# Patient Record
Sex: Female | Born: 2005 | Race: White | Hispanic: No | Marital: Single | Smoking: Never smoker
Health system: Southern US, Community
[De-identification: ages and names within clinical notes are randomized; demographics above are authoritative.]

## PROBLEM LIST (undated history)

## (undated) DIAGNOSIS — L709 Acne, unspecified: Secondary | ICD-10-CM

## (undated) HISTORY — DX: Acne, unspecified: L70.9

---
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
## 2023-01-26 ENCOUNTER — Encounter: Payer: Self-pay | Admitting: Dermatology

## 2023-01-26 ENCOUNTER — Ambulatory Visit: Payer: BC Managed Care – PPO | Admitting: Dermatology

## 2023-01-26 VITALS — BP 117/66

## 2023-01-26 DIAGNOSIS — Z79899 Other long term (current) drug therapy: Secondary | ICD-10-CM

## 2023-01-26 DIAGNOSIS — Q841 Congenital morphological disturbances of hair, not elsewhere classified: Secondary | ICD-10-CM

## 2023-01-26 DIAGNOSIS — L700 Acne vulgaris: Secondary | ICD-10-CM

## 2023-01-26 NOTE — Progress Notes (Signed)
   Follow-Up Visit   Subjective  Erica Hicks is a 17 y.o. female who presents for the following: Acne 63m f/u,face improving Doxycycline 20mg  bid, Winlevi cr bid, Aklief cr qhs, Amzeeq foam qhs The patient has spots, moles and lesions to be evaluated, some may be new or changing and the patient may have concern these could be cancer.   The following portions of the chart were reviewed this encounter and updated as appropriate: medications, allergies, medical history  Review of Systems:  No other skin or systemic complaints except as noted in HPI or Assessment and Plan.  Objective  Well appearing patient in no apparent distress; mood and affect are within normal limits.   A focused examination was performed of the following areas: face  Relevant exam findings are noted in the Assessment and Plan.    Assessment & Plan   ACNE VULGARIS face Exam: R cheek L chin inflamed paps  Failed spironolactone due to continuous menstrual bleeding  Chronic and persistent condition with duration or expected duration over one year. Condition is improving with treatment. currently at goal (patient satisfied).   Treatment Plan: Cont Doxycycline 20mg  1 po bid with food and drink (pt will call for refills) Cont Winlevi cr bid to face Cont Aklief cr qhs to face Cont Amzeeq foam qhs to face  Doxycycline should be taken with food to prevent nausea. Do not lay down for 30 minutes after taking. Be cautious with sun exposure and use good sun protection while on this medication. Pregnant women should not take this medication.   Topical retinoid medications like tretinoin/Retin-A, adapalene/Differin, tazarotene/Fabior, and Epiduo/Epiduo Forte can cause dryness and irritation when first started. Only apply a pea-sized amount to the entire affected area. Avoid applying it around the eyes, edges of mouth and creases at the nose. If you experience irritation, use a good moisturizer first and/or apply  the medicine less often. If you are doing well with the medicine, you can increase how often you use it until you are applying every night. Be careful with sun protection while using this medication as it can make you sensitive to the sun. This medicine should not be used by pregnant women.   Long term medication management.  Patient is using long term (months to years) prescription medication  to control their dermatologic condition.  These medications require periodic monitoring to evaluate for efficacy and side effects and may require periodic laboratory monitoring.   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 Cosmetically subtle  Return in about 1 year (around 01/26/2024) for Acne f/u.  I, Ardis Rowan, RMA, am acting as scribe for Elie Goody, MD .   Documentation: I have reviewed the above documentation for accuracy and completeness, and I agree with the above.  Elie Goody, MD

## 2023-01-26 NOTE — Patient Instructions (Addendum)

## 2023-11-21 ENCOUNTER — Ambulatory Visit: Admitting: Dermatology

## 2023-11-21 DIAGNOSIS — Z79899 Other long term (current) drug therapy: Secondary | ICD-10-CM

## 2023-11-21 DIAGNOSIS — Z7189 Other specified counseling: Secondary | ICD-10-CM | POA: Diagnosis not present

## 2023-11-21 DIAGNOSIS — L700 Acne vulgaris: Secondary | ICD-10-CM

## 2023-11-21 MED ORDER — DOXYCYCLINE HYCLATE 100 MG PO CAPS: 100.0000 mg | ORAL_CAPSULE | Freq: Two times a day (BID) | ORAL | 0 refills | Status: AC

## 2023-11-21 MED ORDER — DOXYCYCLINE HYCLATE 100 MG PO CAPS: 100.0000 mg | ORAL_CAPSULE | Freq: Two times a day (BID) | ORAL | 0 refills | Status: DC

## 2023-11-21 NOTE — Progress Notes (Unsigned)
   Follow-Up Visit   Subjective  Erica Hicks is a 18 y.o. female who presents for the following: acne flare. Patient advises that she stopped taking and using rx'd acne medications in February/March because they weren't working, started to get more oily and more blackheads. Patient's acne flared last week.  The following portions of the chart were reviewed this encounter and updated as appropriate: medications, allergies, medical history  Review of Systems:  No other skin or systemic complaints except as noted in HPI or Assessment and Plan.  Objective  Well appearing patient in no apparent distress; mood and affect are within normal limits.   A focused examination was performed of the following areas: face  Relevant exam findings are noted in the Assessment and Plan.    Assessment & Plan   ACNE VULGARIS Exam: Open comedones and inflammatory papules***  Chronic and persistent condition with duration or expected duration over one year. Condition is bothersome/symptomatic for patient. Currently flared.  Treatment Plan: Start doxycycline  100 mg BID with food.  Doxycycline  should be taken with food to prevent nausea. Do not lay down for 30 minutes after taking. Be cautious with sun exposure and use good sun protection while on this medication. Pregnant women should not take this medication.   No hx of IBD, Crohn's.   Abstinence for birth control iPledge # [id number] Patient registered in iPledge program, in-office UPT negative.   Isotretinoin Counseling; Review and Contraception Counseling: Reviewed potential side effects of isotretinoin including xerosis, cheilitis, hepatitis, hyperlipidemia, and severe birth defects if taken by a pregnant woman.  Women on isotretinoin must be celibate (not having sex) or required to use at least 2 birth control methods to prevent pregnancy (unless patient is a female of non-child bearing potential).  Females of child-bearing potential  must have monthly pregnancy tests while on isotretinoin and report through I-Pledge (FDA monitoring program). Reviewed reports of suicidal ideation in those with a history of depression while taking isotretinoin and reports of diagnosis of inflammatory bowl disease (IBD) while taking isotretinoin as well as the lack of evidence for a causal relationship between isotretinoin, depression and IBD. Patient advised to reach out with any questions or concerns. Patient advised not to share pills or donate blood while on treatment or for one month after completing treatment. All patient's considering Isotretinoin must read and understand and sign Isotretinoin Consent Form and be registered with I-Pledge.  ACNE VULGARIS   Related Procedures ALT Triglycerides Related Medications AKLIEF  0.005 % CREA Apply a pea-sized amount to face every night as tolerated. Clascoterone  (WINLEVI ) 1 % CREA Apply a thin coat to the face BID. Minocycline  HCl Micronized (AMZEEQ ) 4 % FOAM Apply a very thin layer to the face on top of Aklief  QHS.  Return in about 30 days (around 12/21/2023) for Isotretinoin, with Dr. Claudene.  LILLETTE Lonell Drones, RMA, am acting as scribe for Boneta Claudene, MD .   Documentation: I have reviewed the above documentation for accuracy and completeness, and I agree with the above.  Boneta Claudene, MD

## 2023-11-21 NOTE — Patient Instructions (Signed)
Isotretinoin Counseling; Review and Contraception Counseling: Reviewed potential side effects of isotretinoin including xerosis, cheilitis, hepatitis, hyperlipidemia, and severe birth defects if taken by a pregnant woman.  Women on isotretinoin must be celibate (not having sex) or required to use at least 2 birth control methods to prevent pregnancy (unless patient is a female of non-child bearing potential).  Females of child-bearing potential must have monthly pregnancy tests while on isotretinoin and report through I-Pledge (FDA monitoring program). Reviewed reports of suicidal ideation in those with a history of depression while taking isotretinoin and reports of diagnosis of inflammatory bowl disease (IBD) while taking isotretinoin as well as the lack of evidence for a causal relationship between isotretinoin, depression and IBD. Patient advised to reach out with any questions or concerns. Patient advised not to share pills or donate blood while on treatment or for one month after completing treatment. All patient's considering Isotretinoin must read and understand and sign Isotretinoin Consent Form and be registered with I-Pledge.   Due to recent changes in healthcare laws, you may see results of your pathology and/or laboratory studies on MyChart before the doctors have had a chance to review them. We understand that in some cases there may be results that are confusing or concerning to you. Please understand that not all results are received at the same time and often the doctors may need to interpret multiple results in order to provide you with the best plan of care or course of treatment. Therefore, we ask that you please give [place] 2 business days to thoroughly review all your results before contacting the office for clarification. Should we see a critical lab result, you will be contacted sooner.   If You Need Anything After Your Visit  If you have any questions or concerns for your doctor,  please call our main line at [phone] and press option 4 to reach your doctor's medical assistant. If no one answers, please leave a voicemail as directed and we will return your call as soon as possible. Messages left after 4 pm will be answered the following business day.   You may also send [place] a message via MyChart. We typically respond to MyChart messages within 1-2 business days.  For prescription refills, please ask your pharmacy to contact our office. Our fax number is [phone].  If you have an urgent issue when the clinic is closed that cannot wait until the next business day, you can page your doctor at the number below.    Please note that while we do our best to be available for urgent issues outside of office hours, we are not available 24/7.   If you have an urgent issue and are unable to reach [place], you may choose to seek medical care at your doctor's office, retail clinic, urgent care center, or emergency room.  If you have a medical emergency, please immediately call 911 or go to the emergency department.  Pager Numbers  - Dr. Gwen Pounds: [phone]  - Dr. Roseanne Reno: [phone]  - Dr. Katrinka Blazing: [phone]   In the event of inclement weather, please call our main line at [phone] for an update on the status of any delays or closures.  Dermatology Medication Tips: Please keep the boxes that topical medications come in in order to help keep track of the instructions about where and how to use these. Pharmacies typically print the medication instructions only on the boxes and not directly on the medication tubes.   If your medication is too expensive,  please contact our office at [phone] option 4 or send [place] a message through MyChart.   We are unable to tell what your co-pay for medications will be in advance as this is different depending on your insurance coverage. However, we may be able to find a substitute medication at lower cost or fill out paperwork to  get insurance to cover a needed medication.   If a prior authorization is required to get your medication covered by your insurance company, please allow [place] 1-2 business days to complete this process.  Drug prices often vary depending on where the prescription is filled and some pharmacies may offer cheaper prices.  The website www.goodrx.com contains coupons for medications through different pharmacies. The prices here do not account for what the cost may be with help from insurance (it may be cheaper with your insurance), but the website can give you the price if you did not use any insurance.  - You can print the associated coupon and take it with your prescription to the pharmacy.  - You may also stop by our office during regular business hours and pick up a GoodRx coupon card.  - If you need your prescription sent electronically to a different pharmacy, notify our office through [facility] or by phone at [phone] option 4.     Si Usted Necesita Algo Despus de Su Visita  Tambin puede enviarnos un mensaje a travs de Clinical cytogeneticist. Por lo general respondemos a los mensajes de MyChart en el transcurso de 1 a 2 das hbiles.  Para renovar recetas, por favor pida a su farmacia que se ponga en contacto con nuestra oficina. Annie Sable de fax es [place] [phone].  Si tiene un asunto urgente cuando la clnica est cerrada y que no puede esperar hasta el siguiente da hbil, puede llamar/localizar a su doctor(a) al nmero que aparece a continuacin.   Por favor, tenga en cuenta que aunque hacemos todo lo posible para estar disponibles para asuntos urgentes fuera del horario de [place], no estamos disponibles las 24 horas del da, los 7 [address]  [address] de la [place].   Si tiene un problema urgente y no puede comunicarse con nosotros, puede optar por buscar atencin mdica  en el consultorio de su doctor(a), en una clnica privada, en un centro de atencin urgente o en una sala de emergencias.  Si  tiene Engineer, drilling, por favor llame inmediatamente al 911 o vaya a la sala de emergencias.  Nmeros de bper  - Dr. Gwen Pounds: [phone]  - Dra. Roseanne Reno: [phone]  - Dr. Katrinka Blazing: [phone]   En caso de inclemencias del tiempo, por favor llame a Lacy Duverney principal al [phone] para una actualizacin sobre el [place] de cualquier retraso o cierre.  Consejos para la medicacin en dermatologa: Por favor, guarde las cajas en las que vienen los medicamentos de uso tpico para ayudarle a seguir las instrucciones sobre dnde y cmo usarlos. Las farmacias generalmente imprimen las instrucciones del medicamento slo en las cajas y no directamente en los tubos del [place].   Si su medicamento es muy caro, por favor, pngase en contacto con Rolm Gala llamando al [phone] y presione la opcin 4 o envenos un mensaje a travs de Clinical cytogeneticist.   No podemos decirle cul ser su copago por los medicamentos por adelantado ya que esto es diferente dependiendo de la cobertura de su seguro. Sin embargo, es posible que podamos encontrar un medicamento sustituto a Audiological scientist un formulario para que el seguro  cubra el medicamento que se considera necesario.   Si se requiere una autorizacin previa para que su compaa de seguros [place] su medicamento, por favor permtanos de 1 a 2 das hbiles para completar [address]  Los precios de los medicamentos varan con frecuencia dependiendo del Environmental consultant de dnde se surte la receta y alguna farmacias pueden ofrecer precios ms baratos.  El sitio web www.goodrx.com tiene cupones para medicamentos de Health and safety inspector. Los precios aqu no tienen en cuenta lo que podra costar con la ayuda del seguro (puede ser ms barato con su seguro), pero el sitio web puede darle el precio si no utiliz Tourist information centre manager.  - Puede imprimir el cupn correspondiente y llevarlo con su receta a la farmacia.  - Tambin puede pasar por nuestra oficina  durante el horario de atencin regular y Education officer, museum una tarjeta de cupones de GoodRx.  - Si necesita que su receta se enve electrnicamente a una farmacia diferente, informe a nuestra oficina a travs de MyChart de [place] o por telfono llamando al [phone] y presione la opcin 4.

## 2023-11-22 ENCOUNTER — Encounter: Payer: Self-pay | Admitting: Dermatology

## 2023-11-28 ENCOUNTER — Ambulatory Visit: Payer: Self-pay | Admitting: Dermatology

## 2023-11-28 LAB — ALT: ALT: 10 IU/L (ref 0–32)

## 2023-11-28 LAB — TRIGLYCERIDES: Triglycerides: 97 mg/dL (ref 0–149)

## 2023-11-28 NOTE — Telephone Encounter (Signed)
 Patient advised labs ok. Keep appt as scheduled. Lonell RAMAN., RMA

## 2023-11-28 NOTE — Telephone Encounter (Signed)
-----   Message from [place] sent at 11/28/2023 10:59 AM EDT ----- Please call to share that ALT and triglycerides were normal. Patient will not get MyChart notification with message. Thank you ----- Message ----- From: Interface, [facility] Lab Results In Sent: 11/28/2023   7:36 AM EDT To: Boneta Sharps, MD

## 2023-12-28 ENCOUNTER — Ambulatory Visit

## 2023-12-28 ENCOUNTER — Ambulatory Visit (INDEPENDENT_AMBULATORY_CARE_PROVIDER_SITE_OTHER): Admitting: Dermatology

## 2023-12-28 ENCOUNTER — Encounter: Payer: Self-pay | Admitting: Dermatology

## 2023-12-28 DIAGNOSIS — Z7189 Other specified counseling: Secondary | ICD-10-CM | POA: Diagnosis not present

## 2023-12-28 DIAGNOSIS — L700 Acne vulgaris: Secondary | ICD-10-CM | POA: Diagnosis not present

## 2023-12-28 DIAGNOSIS — Z79899 Other long term (current) drug therapy: Secondary | ICD-10-CM

## 2023-12-28 MED ORDER — ISOTRETINOIN 20 MG PO CAPS: 20.0000 mg | ORAL_CAPSULE | Freq: Every day | ORAL | 0 refills | Status: DC

## 2023-12-28 NOTE — Patient Instructions (Addendum)

## 2023-12-28 NOTE — Progress Notes (Signed)
   Follow-Up Visit   Subjective  Erica Hicks is a 18 y.o. female who presents for the following: Acne, face, pt d/c Doxycycline  100mg  1 wk ago, pt did see a little improvement with Doxycycline , pt to start Isotretinoin    The following portions of the chart were reviewed this encounter and updated as appropriate: medications, allergies, medical history  Review of Systems:  No other skin or systemic complaints except as noted in HPI or Assessment and Plan.  Objective  Well appearing patient in no apparent distress; mood and affect are within normal limits.   A focused examination was performed of the following areas: face  Relevant exam findings are noted in the Assessment and Plan.    Assessment & Plan   ACNE VULGARIS severe face  Exam: numerous inflammatory papules and scars on cheeks chin   Chronic and persistent condition with duration or expected duration over one year. Condition is bothersome/symptomatic for patient. Currently flared.   Treatment Plan: Urine pregnancy test performed in office today and was negative.  Patient demonstrates comprehension and confirms she will not get pregnant. L ot [id number] exp 06/13/2025  ALT TG baseline normal D/C Doxycycline  Birth control - abstinence  iPledge # [id number] Pharmacy - [facility] confirmed in Ipledge program Start Isotretinoin  20mg  1 po qd with fatty meal  Isotretinoin  Counseling; Review and Contraception Counseling: Reviewed potential side effects of isotretinoin  including xerosis, cheilitis, hepatitis, hyperlipidemia, and severe birth defects if taken by a pregnant woman.  Women on isotretinoin  must be celibate (not having sex) or required to use at least 2 birth control methods to prevent pregnancy (unless patient is a female of non-child bearing potential).  Females of child-bearing potential must have monthly pregnancy tests while on isotretinoin  and report through I-Pledge (FDA monitoring  program). Reviewed reports of suicidal ideation in those with a history of depression while taking isotretinoin  and reports of diagnosis of inflammatory bowl disease (IBD) while taking isotretinoin  as well as the lack of evidence for a causal relationship between isotretinoin , depression and IBD. Patient advised to reach out with any questions or concerns. Patient advised not to share pills or donate blood while on treatment or for one month after completing treatment. All patient's considering Isotretinoin  must read and understand and sign Isotretinoin  Consent Form and be registered with I-Pledge.     ACNE VULGARIS   Related Medications AKLIEF  0.005 % CREA Apply a pea-sized amount to face every night as tolerated. Clascoterone  (WINLEVI ) 1 % CREA Apply a thin coat to the face BID. Minocycline  HCl Micronized (AMZEEQ ) 4 % FOAM Apply a very thin layer to the face on top of [place]  QHS. LONG-TERM USE OF HIGH-RISK MEDICATION   COUNSELING AND COORDINATION OF CARE    Return in about 1 month (around 01/27/2024) for Isotretinoin  f/u.  I, Grayce Saunas, RMA, am acting as scribe for Boneta Sharps, MD .   Documentation: I have reviewed the above documentation for accuracy and completeness, and I agree with the above.  Boneta Sharps, MD

## 2024-01-25 ENCOUNTER — Ambulatory Visit: Admitting: Dermatology

## 2024-01-25 ENCOUNTER — Encounter: Payer: Self-pay | Admitting: Dermatology

## 2024-01-25 VITALS — Wt 130.0 lb

## 2024-01-25 DIAGNOSIS — Z7189 Other specified counseling: Secondary | ICD-10-CM

## 2024-01-25 DIAGNOSIS — Z79899 Other long term (current) drug therapy: Secondary | ICD-10-CM | POA: Diagnosis not present

## 2024-01-25 DIAGNOSIS — L700 Acne vulgaris: Secondary | ICD-10-CM | POA: Diagnosis not present

## 2024-01-25 DIAGNOSIS — K130 Diseases of lips: Secondary | ICD-10-CM

## 2024-01-25 MED ORDER — ISOTRETINOIN 30 MG PO CAPS: 30.0000 mg | ORAL_CAPSULE | Freq: Every day | ORAL | 0 refills | Status: AC

## 2024-01-25 NOTE — Patient Instructions (Signed)
 Increase to Isotretinoin  30 mg once daily with food.   While taking Isotretinoin  and for 30 days after you finish the medication, do not get pregnant, do not share pills, do not donate blood.  Generic isotretinoin  is best absorbed when taken with a fatty meal. Isotretinoin  can make you sensitive to the sun. Daily careful sun protection including sunscreen SPF 30+ when outdoors is recommended.   Due to recent changes in healthcare laws, you may see results of your pathology and/or laboratory studies on MyChart before the doctors have had a chance to review them. We understand that in some cases there may be results that are confusing or concerning to you. Please understand that not all results are received at the same time and often the doctors may need to interpret multiple results in order to provide you with the best plan of care or course of treatment. Therefore, we ask that you please give us  2 business days to thoroughly review all your results before contacting the office for clarification. Should we see a critical lab result, you will be contacted sooner.   If You Need Anything After Your Visit  If you have any questions or concerns for your doctor, please call our main line at [phone] and press option 4 to reach your doctor's medical assistant. If no one answers, please leave a voicemail as directed and we will return your call as soon as possible. Messages left after 4 pm will be answered the following business day.   You may also send us  a message via MyChart. We typically respond to MyChart messages within 1-2 business days.  For prescription refills, please ask your pharmacy to contact our office. Our fax number is [phone].  If you have an urgent issue when the clinic is closed that cannot wait until the next business day, you can page your doctor at the number below.    Please note that while we do our best to be available for urgent issues outside of office hours, we are  not available 24/7.   If you have an urgent issue and are unable to reach us , you may choose to seek medical care at your doctor's office, retail clinic, urgent care center, or emergency room.  If you have a medical emergency, please immediately call 911 or go to the emergency department.  Pager Numbers  - Dr. Hester: [phone]  - Dr. Jackquline: [phone]  - Dr. Claudene: [phone]   - Dr. Raymund: [phone]  In the event of inclement weather, please call our main line at [phone] for an update on the status of any delays or closures.  Dermatology Medication Tips: Please keep the boxes that topical medications come in in order to help keep track of the instructions about where and how to use these. Pharmacies typically print the medication instructions only on the boxes and not directly on the medication tubes.   If your medication is too expensive, please contact our office at [phone] option 4 or send us  a message through MyChart.   We are unable to tell what your co-pay for medications will be in advance as this is different depending on your insurance coverage. However, we may be able to find a substitute medication at lower cost or fill out paperwork to get insurance to cover a needed medication.   If a prior authorization is required to get your medication covered by your insurance company, please allow us  1-2 business days to complete this process.  Drug prices often vary  depending on where the prescription is filled and some pharmacies may offer cheaper prices.  The website www.goodrx.com contains coupons for medications through different pharmacies. The prices here do not account for what the cost may be with help from insurance (it may be cheaper with your insurance), but the website can give you the price if you did not use any insurance.  - You can print the associated coupon and take it with your prescription to the pharmacy.  - You may also stop by our  office during regular business hours and pick up a GoodRx coupon card.  - If you need your prescription sent electronically to a different pharmacy, notify our office through [facility] or by phone at [phone] option 4.     Si Usted Necesita Algo Despus de Su Visita  Tambin puede enviarnos un mensaje a travs de Clinical cytogeneticist. Por lo general respondemos a los mensajes de MyChart en el transcurso de 1 a 2 das hbiles.  Para renovar recetas, por favor pida a su farmacia que se ponga en contacto con nuestra oficina. Randi lakes de fax es [place] [phone].  Si tiene un asunto urgente cuando la clnica est cerrada y que no puede esperar hasta el siguiente da hbil, puede llamar/localizar a su doctor(a) al nmero que aparece a continuacin.   Por favor, tenga en cuenta que aunque hacemos todo lo posible para estar disponibles para asuntos urgentes fuera del horario de [place], no estamos disponibles las 24 horas del da, los 7 [address]  [address] de la [place].   Si tiene un problema urgente y no puede comunicarse con nosotros, puede optar por buscar atencin mdica  en el consultorio de su doctor(a), en una clnica privada, en un centro de atencin urgente o en una sala de emergencias.  Si tiene Engineer, drilling, por favor llame inmediatamente al 911 o vaya a la sala de emergencias.  Nmeros de bper  - Dr. Hester: [phone]  - Dra. Jackquline: [phone]  - Dr. Claudene: [phone]  - Dra. Kitts: [phone]  En caso de inclemencias del [place], por favor llame a nuestra lnea principal al [phone] para una actualizacin sobre el [place] de cualquier retraso o cierre.  Consejos para la medicacin en dermatologa: Por favor, guarde las cajas en las que vienen los medicamentos de uso tpico para ayudarle a seguir las instrucciones sobre dnde y cmo usarlos. Las farmacias generalmente imprimen las instrucciones del medicamento slo en las cajas y no directamente en los tubos del  [place].   Si su medicamento es muy caro, por favor, pngase en contacto con landry rieger llamando al [phone] y presione la opcin 4 o envenos un mensaje a travs de Clinical cytogeneticist.   No podemos decirle cul ser su copago por los medicamentos por adelantado ya que esto es diferente dependiendo de la cobertura de su seguro. Sin embargo, es posible que podamos encontrar un medicamento sustituto a Audiological scientist un formulario para que el seguro cubra el medicamento que se considera necesario.   Si se requiere una autorizacin previa para que su compaa de seguros [place] su medicamento, por favor permtanos de 1 a 2 das hbiles para completar este proceso.  Los precios de los medicamentos varan con frecuencia dependiendo del Environmental consultant de dnde se surte la receta y alguna farmacias pueden ofrecer precios ms baratos.  El sitio web www.goodrx.com tiene cupones para medicamentos de Health and safety inspector. Los precios aqu no tienen en cuenta lo que podra costar con la ayuda del seguro (puede ser  ms barato con su seguro), pero el sitio web puede darle el precio si no Visual merchandiser.  - Puede imprimir el cupn correspondiente y llevarlo con su receta a la farmacia.  - Tambin puede pasar por nuestra oficina durante el horario de atencin regular y Education officer, museum una tarjeta de cupones de GoodRx.  - Si necesita que su receta se enve electrnicamente a una farmacia diferente, informe a nuestra oficina a travs de MyChart de [place] o por telfono llamando al [phone] y presione la opcin 4.

## 2024-01-25 NOTE — Progress Notes (Signed)
 Isotretinoin  Follow-Up Visit   Subjective  Erica Hicks is a 18 y.o. female who presents for the following: Isotretinoin  follow-up  Week # 4   Isotretinoin  F/U - 01/25/24 1600       Isotretinoin  Follow Up   iPledge # [id number]    Date 01/25/24    Weight 130 lb (59 kg)    Two Forms of Birth Control Abstinence    Acne breakouts since last visit? Yes      Dosage   Target Dosage (mg) 11800    Current (To Date) Dosage (mg) 600    To Go Dosage (mg) 11200      Skin Side Effects   Dry Lips Yes    Nose bleeds No    Dry eyes No    Dry Skin No    Sunburn No      Gastrointestinal Side Effects   Nausea No    Diarrhea No    Blood in stool No      Neurological Side Effects   Blurred vision No    Depression No    Headache No    Homicidal thoughts No    Mood Changes No    Suicidal thoughts No      Constitutional Side Effects   Fatigue No      Musculoskeletal Side Effects   Muscle aches No      Labs Notes   Last labs done 11/27/23           Side effects: Dry skin, dry lips  Patient is not pregnant, not seeking pregnancy, and not breastfeeding.   The following portions of the chart were reviewed this encounter and updated as appropriate: medications, allergies, medical history  Review of Systems:  No other skin or systemic complaints except as noted in HPI or Assessment and Plan.  Objective  Well appearing patient in no apparent distress; mood and affect are within normal limits.  An examination of the face, neck, chest, and back was performed and relevant findings are noted below.     Assessment & Plan   ACNE VULGARIS   Related Medications ISOtretinoin  (ACCUTANE ) 30 MG capsule Take 1 capsule (30 mg total) by mouth daily. LONG-TERM USE OF HIGH-RISK MEDICATION   Related Medications ISOtretinoin  (ACCUTANE ) 30 MG capsule Take 1 capsule (30 mg total) by mouth daily. COUNSELING AND COORDINATION OF CARE   MEDICATION MANAGEMENT    ACNE  VULGARIS Patient is currently on Isotretinoin  requiring FDA mandated monthly evaluations and laboratory monitoring. Condition is currently not to goal (must reach target dose based on weight and also have clear skin for 2 months prior to discontinuation in order to help prevent relapse)  Exam findings: Reduced inflamed papules on cheeks forehead  Week # 4 Pharmacy [facility] # [id number]  Total mg -  600 mg Total mg/kg - 10.17 mg/kg Birth Control- Abstinence  Increase to isotretinoin  30 mg daily, tolerating well  Urine pregnancy test performed in office today and was negative.  Patient demonstrates comprehension and confirms she will not get pregnant.  Lot: [id number] Exp: 06/13/2025  Patient confirmed in iPledge and isotretinoin  sent to pharmacy.   Isotretinoin  Counseling; Review and Contraception Counseling: Reviewed potential side effects of isotretinoin  including xerosis, cheilitis, hepatitis, hyperlipidemia, and severe birth defects if taken by a pregnant woman.  Women on isotretinoin  must be celibate (not having sex) or required to use at least 2 birth control methods to prevent pregnancy (unless patient is a female of non-child bearing potential).  Females of child-bearing potential must have monthly pregnancy tests while on isotretinoin  and report through I-Pledge (FDA monitoring program). Reviewed reports of suicidal ideation in those with a history of depression while taking isotretinoin  and reports of diagnosis of inflammatory bowl disease (IBD) while taking isotretinoin  as well as the lack of evidence for a causal relationship between isotretinoin , depression and IBD. Patient advised to reach out with any questions or concerns. Patient advised not to share pills or donate blood while on treatment or for one month after completing treatment. All patient's considering Isotretinoin  must read and understand and sign Isotretinoin  Consent Form and be registered with  I-Pledge.  Xerosis secondary to isotretinoin  therapy - Continue emollients as directed - Xyzal (levocetirizine) once a day and fish oil 1 gram daily may also help with dryness   Cheilitis secondary to isotretinoin  therapy - Continue lip balm as directed, Dr. Horald Cortibalm recommended   Long term medication management (isotretinoin )  Patient is using long term (months to years) prescription medication  to control their dermatologic condition.  These medications require periodic monitoring to evaluate for efficacy and side effects and may require periodic laboratory monitoring.  - While taking Isotretinoin  and for 30 days after you finish the medication, do not get pregnant, do not share pills, do not donate blood. Isotretinoin  is best absorbed when taken with a fatty meal. Isotretinoin  can make you sensitive to the sun. Daily careful sun protection including sunscreen SPF 30+ when outdoors is recommended.  Follow-up in 30 days.  I, Jill Parcell, CMA, am acting as scribe for Boneta Sharps, MD.   Documentation: I have reviewed the above documentation for accuracy and completeness, and I agree with the above.  Boneta Sharps, MD

## 2024-01-29 ENCOUNTER — Ambulatory Visit: Admitting: Dermatology

## 2024-02-01 ENCOUNTER — Ambulatory Visit: Payer: BC Managed Care – PPO | Admitting: Dermatology

## 2024-02-29 ENCOUNTER — Encounter: Payer: Self-pay | Admitting: Dermatology

## 2024-02-29 ENCOUNTER — Ambulatory Visit: Admitting: Dermatology

## 2024-02-29 VITALS — Wt 130.0 lb

## 2024-02-29 DIAGNOSIS — Z79899 Other long term (current) drug therapy: Secondary | ICD-10-CM

## 2024-02-29 DIAGNOSIS — L700 Acne vulgaris: Secondary | ICD-10-CM

## 2024-02-29 DIAGNOSIS — Z7189 Other specified counseling: Secondary | ICD-10-CM

## 2024-02-29 MED ORDER — ISOTRETINOIN 40 MG PO CAPS: 40.0000 mg | ORAL_CAPSULE | Freq: Every day | ORAL | 0 refills | Status: AC

## 2024-02-29 NOTE — Patient Instructions (Signed)

## 2024-02-29 NOTE — Progress Notes (Signed)
 Isotretinoin  Follow-Up Visit   Subjective  Erica Hicks is a 18 y.o. female who presents for the following: Isotretinoin  follow-up  Week # 8    Isotretinoin  F/U - 02/29/24 1500       Isotretinoin  Follow Up   iPledge # [id number]    Date 02/29/24    Weight 130 lb (59 kg)    Two Forms of Birth Control Abstinence    Acne breakouts since last visit? Yes      Dosage   Target Dosage (mg) 11800    Current (To Date) Dosage (mg) 1500    To Go Dosage (mg) 10300      Skin Side Effects   Dry Lips Yes    Nose bleeds No    Dry eyes Yes    Dry Skin Yes    Sunburn No      Gastrointestinal Side Effects   Nausea No    Diarrhea No    Blood in stool No      Neurological Side Effects   Blurred vision No    Depression No    Headache No    Homicidal thoughts No    Mood Changes No    Suicidal thoughts No      Constitutional Side Effects   Fatigue No      Musculoskeletal Side Effects   Muscle aches Yes   Patient does dance          Side effects: Dry skin, dry lips  Patient is not pregnant, not seeking pregnancy, and not breastfeeding.   The following portions of the chart were reviewed this encounter and updated as appropriate: medications, allergies, medical history  Review of Systems:  No other skin or systemic complaints except as noted in HPI or Assessment and Plan.  Objective  Well appearing patient in no apparent distress; mood and affect are within normal limits.  An examination of the face, neck, chest, and back was performed and relevant findings are noted below.     Assessment & Plan   ACNE VULGARIS   LONG-TERM USE OF HIGH-RISK MEDICATION   COUNSELING AND COORDINATION OF CARE   MEDICATION MANAGEMENT    ACNE VULGARIS Patient is currently on Isotretinoin  requiring FDA mandated monthly evaluations and laboratory monitoring. Condition is currently not to goal (must reach target dose based on weight and also have clear skin for 2 months  prior to discontinuation in order to help prevent relapse)  Exam findings: Few scattered inflamed papules on cheeks  Week # 8 Pharmacy [facility] # [id number]  Total mg -  1500 mg Total mg/kg -  25.42mg /kg Birth Control- Abstinence  Continue isotretinoin  increasing to 40 mg every day   Urine pregnancy test performed in office today and was negative.  Patient demonstrates comprehension and confirms she will not get pregnant.   Patient confirmed in iPledge and isotretinoin  sent to pharmacy.   Isotretinoin  Counseling; Review and Contraception Counseling: Reviewed potential side effects of isotretinoin  including xerosis, cheilitis, hepatitis, hyperlipidemia, and severe birth defects if taken by a pregnant woman.  Women on isotretinoin  must be celibate (not having sex) or required to use at least 2 birth control methods to prevent pregnancy (unless patient is a female of non-child bearing potential).  Females of child-bearing potential must have monthly pregnancy tests while on isotretinoin  and report through I-Pledge (FDA monitoring program). Reviewed reports of suicidal ideation in those with a history of depression while taking isotretinoin  and reports of diagnosis of inflammatory bowl disease (  IBD) while taking isotretinoin  as well as the lack of evidence for a causal relationship between isotretinoin , depression and IBD. Patient advised to reach out with any questions or concerns. Patient advised not to share pills or donate blood while on treatment or for one month after completing treatment. All patient's considering Isotretinoin  must read and understand and sign Isotretinoin  Consent Form and be registered with I-Pledge.  Xerosis secondary to isotretinoin  therapy - Continue emollients as directed - Xyzal (levocetirizine) once a day and fish oil 1 gram daily may also help with dryness   Cheilitis secondary to isotretinoin  therapy - Continue lip balm as directed, Dr. Horald  Cortibalm recommended   Long term medication management (isotretinoin ).  Patient is using long term (months to years) prescription medication  to control their dermatologic condition.  These medications require periodic monitoring to evaluate for efficacy and side effects and may require periodic laboratory monitoring.  - While taking Isotretinoin  and for 30 days after you finish the medication, do not get pregnant, do not share pills, do not donate blood. Isotretinoin  is best absorbed when taken with a fatty meal. Isotretinoin  can make you sensitive to the sun. Daily careful sun protection including sunscreen SPF 30+ when outdoors is recommended.  Follow-up in 30 days.  LILLETTE Lonell Drones, RMA, am acting as scribe for Boneta Sharps, MD .   Documentation: I have reviewed the above documentation for accuracy and completeness, and I agree with the above.  Boneta Sharps, MD

## 2024-03-03 ENCOUNTER — Encounter: Payer: Self-pay | Admitting: Dermatology

## 2024-04-01 ENCOUNTER — Encounter: Payer: Self-pay | Admitting: Dermatology

## 2024-04-01 ENCOUNTER — Ambulatory Visit: Admitting: Dermatology

## 2024-04-01 VITALS — Wt 130.0 lb

## 2024-04-01 DIAGNOSIS — Z79899 Other long term (current) drug therapy: Secondary | ICD-10-CM

## 2024-04-01 DIAGNOSIS — Z7189 Other specified counseling: Secondary | ICD-10-CM | POA: Diagnosis not present

## 2024-04-01 DIAGNOSIS — L700 Acne vulgaris: Secondary | ICD-10-CM

## 2024-04-01 MED ORDER — ISOTRETINOIN 30 MG PO CAPS: 60.0000 mg | ORAL_CAPSULE | Freq: Every day | ORAL | 0 refills | Status: DC

## 2024-04-01 NOTE — Patient Instructions (Addendum)
 Continue isotretinoin  increase to 30 mg 2 capsules daily or 1 capsule twice daily    Isotretinoin  Counseling; Review and Contraception Counseling: Reviewed potential side effects of isotretinoin  including xerosis, cheilitis, hepatitis, hyperlipidemia, and severe birth defects if taken by a pregnant woman.  Women on isotretinoin  must be celibate (not having sex) or required to use at least 2 birth control methods to prevent pregnancy (unless patient is a female of non-child bearing potential).  Females of child-bearing potential must have monthly pregnancy tests while on isotretinoin  and report through I-Pledge (FDA monitoring program). Reviewed reports of suicidal ideation in those with a history of depression while taking isotretinoin  and reports of diagnosis of inflammatory bowl disease (IBD) while taking isotretinoin  as well as the lack of evidence for a causal relationship between isotretinoin , depression and IBD. Patient advised to reach out with any questions or concerns. Patient advised not to share pills or donate blood while on treatment or for one month after completing treatment. All patient's considering Isotretinoin  must read and understand and sign Isotretinoin  Consent Form and be registered with I-Pledge.   If you need something for pain relief you may take 1 extra strength Tylenol (acetaminophen) AND 2 Ibuprofen (200mg  each) together every 4 hours as needed for pain. (do not take these if you are allergic to them or if you have a reason you should not take them.)      Due to recent changes in healthcare laws, you may see results of your pathology and/or laboratory studies on MyChart before the doctors have had a chance to review them. We understand that in some cases there may be results that are confusing or concerning to you. Please understand that not all results are received at the same time and often the doctors may need to interpret multiple results in order to provide you with  the best plan of care or course of treatment. Therefore, we ask that you please give us  2 business days to thoroughly review all your results before contacting the office for clarification. Should we see a critical lab result, you will be contacted sooner.   If You Need Anything After Your Visit  If you have any questions or concerns for your doctor, please call our main line at [phone] and press option 4 to reach your doctor's medical assistant. If no one answers, please leave a voicemail as directed and we will return your call as soon as possible. Messages left after 4 pm will be answered the following business day.   You may also send us  a message via MyChart. We typically respond to MyChart messages within 1-2 business days.  For prescription refills, please ask your pharmacy to contact our office. Our fax number is [phone].  If you have an urgent issue when the clinic is closed that cannot wait until the next business day, you can page your doctor at the number below.    Please note that while we do our best to be available for urgent issues outside of office hours, we are not available 24/7.   If you have an urgent issue and are unable to reach us , you may choose to seek medical care at your doctor's office, retail clinic, urgent care center, or emergency room.  If you have a medical emergency, please immediately call 911 or go to the emergency department.  Pager Numbers  - Dr. Hester: [phone]  - Dr. Jackquline: [phone]  - Dr. Claudene: [phone]   - Dr. Raymund: [phone]  In the  event of inclement weather, please call our main line at [phone] for an update on the status of any delays or closures.  Dermatology Medication Tips: Please keep the boxes that topical medications come in in order to help keep track of the instructions about where and how to use these. Pharmacies typically print the medication instructions only on the boxes and not directly on  the medication tubes.   If your medication is too expensive, please contact our office at [phone] option 4 or send us  a message through MyChart.   We are unable to tell what your co-pay for medications will be in advance as this is different depending on your insurance coverage. However, we may be able to find a substitute medication at lower cost or fill out paperwork to get insurance to cover a needed medication.   If a prior authorization is required to get your medication covered by your insurance company, please allow us  1-2 business days to complete this process.  Drug prices often vary depending on where the prescription is filled and some pharmacies may offer cheaper prices.  The website www.goodrx.com contains coupons for medications through different pharmacies. The prices here do not account for what the cost may be with help from insurance (it may be cheaper with your insurance), but the website can give you the price if you did not use any insurance.  - You can print the associated coupon and take it with your prescription to the pharmacy.  - You may also stop by our office during regular business hours and pick up a GoodRx coupon card.  - If you need your prescription sent electronically to a different pharmacy, notify our office through [facility] or by phone at [phone] option 4.     Si Usted Necesita Algo Despus de Su Visita  Tambin puede enviarnos un mensaje a travs de Clinical Cytogeneticist. Por lo general respondemos a los mensajes de MyChart en el transcurso de 1 a 2 das hbiles.  Para renovar recetas, por favor pida a su farmacia que se ponga en contacto con nuestra oficina. Randi lakes de fax es [place] [phone].  Si tiene un asunto urgente cuando la clnica est cerrada y que no puede esperar hasta el siguiente da hbil, puede llamar/localizar a su doctor(a) al nmero que aparece a continuacin.   Por favor, tenga en cuenta que aunque hacemos todo lo  posible para estar disponibles para asuntos urgentes fuera del horario de [place], no estamos disponibles las 24 horas del da, los 7 [address]  [address] de la [place].   Si tiene un problema urgente y no puede comunicarse con nosotros, puede optar por buscar atencin mdica  en el consultorio de su doctor(a), en una clnica privada, en un centro de atencin urgente o en una sala de emergencias.  Si tiene engineer, drilling, por favor llame inmediatamente al 911 o vaya a la sala de emergencias.  Nmeros de bper  - Dr. Hester: [phone]  - Dra. Jackquline: [phone]  - Dr. Claudene: [phone]  - Dra. Kitts: [phone]  En caso de inclemencias del [place], por favor llame a nuestra lnea principal al [phone] para una actualizacin sobre el estado de cualquier retraso o cierre.  Consejos para la medicacin en dermatologa: Por favor, guarde las cajas en las que vienen los medicamentos de uso tpico para ayudarle a seguir las instrucciones sobre dnde y cmo usarlos. Las farmacias generalmente imprimen las instrucciones del medicamento slo en las cajas y no directamente en los tubos del  medicamento.   Si su medicamento es muy caro, por favor, pngase en contacto con landry rieger llamando al [phone] y presione la opcin 4 o envenos un mensaje a travs de Clinical Cytogeneticist.   No podemos decirle cul ser su copago por los medicamentos por adelantado ya que esto es diferente dependiendo de la cobertura de su seguro. Sin embargo, es posible que podamos encontrar un medicamento sustituto a audiological scientist un formulario para que el seguro cubra el medicamento que se considera necesario.   Si se requiere una autorizacin previa para que su compaa de seguros cubra su medicamento, por favor permtanos de 1 a 2 das hbiles para completar este proceso.  Los precios de los medicamentos varan con frecuencia dependiendo del environmental consultant de dnde se surte la receta y alguna farmacias pueden ofrecer precios  ms baratos.  El sitio web www.goodrx.com tiene cupones para medicamentos de health and safety inspector. Los precios aqu no tienen en cuenta lo que podra costar con la ayuda del seguro (puede ser ms barato con su seguro), pero el sitio web puede darle el precio si no utiliz tourist information centre manager.  - Puede imprimir el cupn correspondiente y llevarlo con su receta a la farmacia.  - Tambin puede pasar por nuestra oficina durante el horario de atencin regular y education officer, museum una tarjeta de cupones de GoodRx.  - Si necesita que su receta se enve electrnicamente a una farmacia diferente, informe a nuestra oficina a travs de MyChart de [place] o por telfono llamando al [phone] y presione la opcin 4.

## 2024-04-01 NOTE — Progress Notes (Signed)
 Isotretinoin  Follow-Up Visit   Subjective  Erica Hicks is a 18 y.o. female who presents for the following: Isotretinoin  follow-up. Taking 40 mg daily. Tolerating well, has noticed increased dryness and increased back pain.   Week # 12   Isotretinoin  F/U - 04/01/24 0900       Isotretinoin  Follow Up   iPledge # [id number]    Date 04/01/24    Weight 130 lb (59 kg)    Two Forms of Birth Control Abstinence    Acne breakouts since last visit? Yes      Dosage   Target Dosage (mg) 11800    Current (To Date) Dosage (mg) 2700    To Go Dosage (mg) 9100      Skin Side Effects   Dry Lips Yes    Nose bleeds No    Dry eyes Yes    Dry Skin Yes    Sunburn No      Gastrointestinal Side Effects   Nausea No    Diarrhea No    Blood in stool No      Neurological Side Effects   Blurred vision No    Depression No    Headache No    Homicidal thoughts No    Mood Changes No    Suicidal thoughts No      Constitutional Side Effects   Fatigue Yes      Musculoskeletal Side Effects   Muscle aches Yes           Side effects: Dry skin, dry lips  Patient is not pregnant, not seeking pregnancy, and not breastfeeding.   The following portions of the chart were reviewed this encounter and updated as appropriate: medications, allergies, medical history  Review of Systems:  No other skin or systemic complaints except as noted in HPI or Assessment and Plan.  Objective  Well appearing patient in no apparent distress; mood and affect are within normal limits.  An examination of the face, neck, chest, and back was performed and relevant findings are noted below.     Assessment & Plan   ACNE VULGARIS   LONG-TERM USE OF HIGH-RISK MEDICATION   COUNSELING AND COORDINATION OF CARE   MEDICATION MANAGEMENT    ACNE VULGARIS Patient is currently on Isotretinoin  requiring FDA mandated monthly evaluations and laboratory monitoring. Condition is currently not to goal  (must reach target dose based on weight and also have clear skin for 2 months prior to discontinuation in order to help prevent relapse)  Exam findings: Inflamed papules on chin and R cheek  Week # 12 [facility]acy [facility] # [id number]  Total mg -  2700 mg Total mg/kg - 45.76 mg/kg Birth Control- Abstinence  Offered same dose given back pain vs continue increase. Patient opts for increase Continue isotretinoin  increase to 30 mg 2 capsules daily or 1 capsule twice daily Ibuprofen tylenol for back pain as needed Urine pregnancy test performed in office today and was negative.  Patient demonstrates comprehension and confirms she will not get pregnant.  Lot: [id number] Exp: 07/31/2025  Patient confirmed in iPledge and isotretinoin  sent to pharmacy.   Isotretinoin  Counseling; Review and Contraception Counseling: Reviewed potential side effects of isotretinoin  including xerosis, cheilitis, hepatitis, hyperlipidemia, and severe birth defects if taken by a pregnant woman.  Women on isotretinoin  must be celibate (not having sex) or required to use at least 2 birth control methods to prevent pregnancy (unless patient is a female of non-child bearing potential).  Females of  child-bearing potential must have monthly pregnancy tests while on isotretinoin  and report through I-Pledge (FDA monitoring program). Reviewed reports of suicidal ideation in those with a history of depression while taking isotretinoin  and reports of diagnosis of inflammatory bowl disease (IBD) while taking isotretinoin  as well as the lack of evidence for a causal relationship between isotretinoin , depression and IBD. Patient advised to reach out with any questions or concerns. Patient advised not to share pills or donate blood while on treatment or for one month after completing treatment. All patient's considering Isotretinoin  must read and understand and sign Isotretinoin  Consent Form and be registered with  I-Pledge.  Xerosis secondary to isotretinoin  therapy - Continue emollients as directed - Xyzal (levocetirizine) once a day and fish oil 1 gram daily may also help with dryness   Cheilitis secondary to isotretinoin  therapy - Continue lip balm as directed, Dr. Horald Cortibalm recommended  If you need something for pain relief you may take 1 extra strength Tylenol (acetaminophen) AND 2 Ibuprofen (200mg  each) together every 4 hours as needed for pain. (do not take these if you are allergic to them or if you have a reason you should not take them.)   Long term medication management (isotretinoin )  Patient is using long term (months to years) prescription medication  to control their dermatologic condition.  These medications require periodic monitoring to evaluate for efficacy and side effects and may require periodic laboratory monitoring.  - While taking Isotretinoin  and for 30 days after you finish the medication, do not get pregnant, do not share pills, do not donate blood. Isotretinoin  is best absorbed when taken with a fatty meal. Isotretinoin  can make you sensitive to the sun. Daily careful sun protection including sunscreen SPF 30+ when outdoors is recommended.  Follow-up in 30 days.  I, Jill Parcell, CMA, am acting as scribe for Boneta Sharps, MD.   Documentation: I have reviewed the above documentation for accuracy and completeness, and I agree with the above.  Boneta Sharps, MD

## 2024-04-25 ENCOUNTER — Encounter: Payer: Self-pay | Admitting: Dermatology

## 2024-04-25 ENCOUNTER — Ambulatory Visit (INDEPENDENT_AMBULATORY_CARE_PROVIDER_SITE_OTHER): Admitting: Dermatology

## 2024-04-25 VITALS — Wt 130.0 lb

## 2024-04-25 DIAGNOSIS — Z79899 Other long term (current) drug therapy: Secondary | ICD-10-CM | POA: Diagnosis not present

## 2024-04-25 DIAGNOSIS — Z7189 Other specified counseling: Secondary | ICD-10-CM

## 2024-04-25 DIAGNOSIS — L700 Acne vulgaris: Secondary | ICD-10-CM | POA: Diagnosis not present

## 2024-04-25 MED ORDER — ISOTRETINOIN 30 MG PO CAPS: 60.0000 mg | ORAL_CAPSULE | Freq: Every day | ORAL | 0 refills | Status: AC

## 2024-04-25 NOTE — Patient Instructions (Addendum)
 In 30 days perform a pregnancy test and send to picture through MyChart.   Continue isotretinoin  30 mg twice daily    Isotretinoin  Counseling; Review and Contraception Counseling: Reviewed potential side effects of isotretinoin  including xerosis, cheilitis, hepatitis, hyperlipidemia, and severe birth defects if taken by a pregnant woman.  Women on isotretinoin  must be celibate (not having sex) or required to use at least 2 birth control methods to prevent pregnancy (unless patient is a female of non-child bearing potential).  Females of child-bearing potential must have monthly pregnancy tests while on isotretinoin  and report through I-Pledge (FDA monitoring program). Reviewed reports of suicidal ideation in those with a history of depression while taking isotretinoin  and reports of diagnosis of inflammatory bowl disease (IBD) while taking isotretinoin  as well as the lack of evidence for a causal relationship between isotretinoin , depression and IBD. Patient advised to reach out with any questions or concerns. Patient advised not to share pills or donate blood while on treatment or for one month after completing treatment. All patient's considering Isotretinoin  must read and understand and sign Isotretinoin  Consent Form and be registered with I-Pledge.    Due to recent changes in healthcare laws, you may see results of your pathology and/or laboratory studies on MyChart before the doctors have had a chance to review them. We understand that in some cases there may be results that are confusing or concerning to you. Please understand that not all results are received at the same time and often the doctors may need to interpret multiple results in order to provide you with the best plan of care or course of treatment. Therefore, we ask that you please give us  2 business days to thoroughly review all your results before contacting the office for clarification. Should we see a critical lab result, you will  be contacted sooner.   If You Need Anything After Your Visit  If you have any questions or concerns for your doctor, please call our main line at [phone] and press option 4 to reach your doctor's medical assistant. If no one answers, please leave a voicemail as directed and we will return your call as soon as possible. Messages left after 4 pm will be answered the following business day.   You may also send us  a message via MyChart. We typically respond to MyChart messages within 1-2 business days.  For prescription refills, please ask your pharmacy to contact our office. Our fax number is [phone].  If you have an urgent issue when the clinic is closed that cannot wait until the next business day, you can page your doctor at the number below.    Please note that while we do our best to be available for urgent issues outside of office hours, we are not available 24/7.   If you have an urgent issue and are unable to reach us , you may choose to seek medical care at your doctor's office, retail clinic, urgent care center, or emergency room.  If you have a medical emergency, please immediately call 911 or go to the emergency department.  Pager Numbers  - Dr. Hester: [phone]  - Dr. Jackquline: [phone]  - Dr. Claudene: [phone]   - Dr. Raymund: [phone]  In the event of inclement weather, please call our main line at [phone] for an update on the status of any delays or closures.  Dermatology Medication Tips: Please keep the boxes that topical medications come in in order to help keep track of the instructions about where  and how to use these. Pharmacies typically print the medication instructions only on the boxes and not directly on the medication tubes.   If your medication is too expensive, please contact our office at [phone] option 4 or send us  a message through MyChart.   We are unable to tell what your co-pay for medications will be in advance as  this is different depending on your insurance coverage. However, we may be able to find a substitute medication at lower cost or fill out paperwork to get insurance to cover a needed medication.   If a prior authorization is required to get your medication covered by your insurance company, please allow us  1-2 business days to complete this process.  Drug prices often vary depending on where the prescription is filled and some pharmacies may offer cheaper prices.  The website www.goodrx.com contains coupons for medications through different pharmacies. The prices here do not account for what the cost may be with help from insurance (it may be cheaper with your insurance), but the website can give you the price if you did not use any insurance.  - You can print the associated coupon and take it with your prescription to the pharmacy.  - You may also stop by our office during regular business hours and pick up a GoodRx coupon card.  - If you need your prescription sent electronically to a different pharmacy, notify our office through [facility] or by phone at [phone] option 4.     Si Usted Necesita Algo Despus de Su Visita  Tambin puede enviarnos un mensaje a travs de Clinical Cytogeneticist. Por lo general respondemos a los mensajes de MyChart en el transcurso de 1 a 2 das hbiles.  Para renovar recetas, por favor pida a su farmacia que se ponga en contacto con nuestra oficina. Randi lakes de fax es Joice [phone].  Si tiene un asunto urgente cuando la clnica est cerrada y que no puede esperar hasta el siguiente da hbil, puede llamar/localizar a su doctor(a) al nmero que aparece a continuacin.   Por favor, tenga en cuenta que aunque hacemos todo lo posible para estar disponibles para asuntos urgentes fuera del horario de [place], no estamos disponibles las 24 horas del da, los 7 [address]  [address] de la [place].   Si tiene un problema urgente y no puede comunicarse con nosotros, puede optar por  buscar atencin mdica  en el consultorio de su doctor(a), en una clnica privada, en un centro de atencin urgente o en una sala de emergencias.  Si tiene engineer, drilling, por favor llame inmediatamente al 911 o vaya a la sala de emergencias.  Nmeros de bper  - Dr. Hester: [phone]  - Dra. Jackquline: [phone]  - Dr. Claudene: [phone]  - Dra. Kitts: [phone]  En caso de inclemencias del [place], por favor llame a nuestra lnea principal al [phone] para una actualizacin sobre el estado de cualquier retraso o cierre.  Consejos para la medicacin en dermatologa: Por favor, guarde las cajas en las que vienen los medicamentos de uso tpico para ayudarle a seguir las instrucciones sobre dnde y cmo usarlos. Las farmacias generalmente imprimen las instrucciones del medicamento slo en las cajas y no directamente en los tubos del [place].   Si su medicamento es muy caro, por favor, pngase en contacto con landry rieger llamando al [phone] y presione la opcin 4 o envenos un mensaje a travs de Clinical Cytogeneticist.   No podemos decirle cul ser su copago por los medicamentos por  adelantado ya que esto es diferente dependiendo de la cobertura de su seguro. Sin embargo, es posible que podamos encontrar un medicamento sustituto a audiological scientist un formulario para que el seguro cubra el medicamento que se considera necesario.   Si se requiere una autorizacin previa para que su compaa de seguros cubra su medicamento, por favor permtanos de 1 a 2 das hbiles para completar este proceso.  Los precios de los medicamentos varan con frecuencia dependiendo del environmental consultant de dnde se surte la receta y alguna farmacias pueden ofrecer precios ms baratos.  El sitio web www.goodrx.com tiene cupones para medicamentos de health and safety inspector. Los precios aqu no tienen en cuenta lo que podra costar con la ayuda del seguro (puede ser ms barato con su seguro), pero el sitio web  puede darle el precio si no utiliz tourist information centre manager.  - Puede imprimir el cupn correspondiente y llevarlo con su receta a la farmacia.  - Tambin puede pasar por nuestra oficina durante el horario de atencin regular y education officer, museum una tarjeta de cupones de GoodRx.  - Si necesita que su receta se enve electrnicamente a una farmacia diferente, informe a nuestra oficina a travs de MyChart de [place] o por telfono llamando al [phone] y presione la opcin 4.

## 2024-04-25 NOTE — Progress Notes (Signed)
 "  Isotretinoin  Follow-Up Visit   Subjective  Erica Hicks is a 19 y.o. female who presents for the following: Isotretinoin  follow-up  Week # 16   Isotretinoin  F/U - 04/25/24 1400       Isotretinoin  Follow Up   iPledge # [id number]    Date 04/25/24    Weight 130 lb (59 kg)    Two Forms of Birth Control Abstinence    Acne breakouts since last visit? Yes      Dosage   Target Dosage (mg) 11800    Current (To Date) Dosage (mg) 4500    To Go Dosage (mg) 7300      Skin Side Effects   Dry Lips Yes    Nose bleeds No    Dry eyes Yes    Dry Skin Yes    Sunburn No      Gastrointestinal Side Effects   Nausea No    Diarrhea No    Blood in stool No      Neurological Side Effects   Blurred vision No    Depression No    Headache No    Homicidal thoughts No    Mood Changes No    Suicidal thoughts No      Constitutional Side Effects   Fatigue Yes      Musculoskeletal Side Effects   Muscle aches Yes   Not worsening.     Labs Notes   Last labs done 11/27/23           Side effects: Dry skin, dry lips  Patient is not pregnant, not seeking pregnancy, and not breastfeeding.   The following portions of the chart were reviewed this encounter and updated as appropriate: medications, allergies, medical history  Review of Systems:  No other skin or systemic complaints except as noted in HPI or Assessment and Plan.  Objective  Well appearing patient in no apparent distress; mood and affect are within normal limits.  An examination of the face, neck, chest, and back was performed and relevant findings are noted below.     Assessment & Plan   ACNE VULGARIS   This Visit - Triglycerides - ALT LONG-TERM USE OF HIGH-RISK MEDICATION   This Visit - Triglycerides - ALT COUNSELING AND COORDINATION OF CARE   MEDICATION MANAGEMENT    ACNE VULGARIS Patient is currently on Isotretinoin  requiring FDA mandated monthly evaluations and laboratory  monitoring. Condition is currently not to goal (must reach target dose based on weight and also have clear skin for 2 months prior to discontinuation in order to help prevent relapse)  Exam findings: Inflamed papules on L chin  Week # 16 Pharmacy [facility] # [id number]  Total mg -  4500 mg Total mg/kg - 76.27 mg/kg Birth Control- Abstinence   Continue isotretinoin  30 mg twice daily Check labs ALT TG  Urine pregnancy test performed in office today and was negative.  Patient demonstrates comprehension and confirms she will not get pregnant.  Lot: [id number] Exp: 10/04/2025  Patient confirmed in iPledge and isotretinoin  sent to pharmacy.   Isotretinoin  Counseling; Review and Contraception Counseling: Reviewed potential side effects of isotretinoin  including xerosis, cheilitis, hepatitis, hyperlipidemia, and severe birth defects if taken by a pregnant woman.  Women on isotretinoin  must be celibate (not having sex) or required to use at least 2 birth control methods to prevent pregnancy (unless patient is a female of non-child bearing potential).  Females of child-bearing potential must have monthly pregnancy tests while on isotretinoin   and report through I-Pledge (FDA monitoring program). Reviewed reports of suicidal ideation in those with a history of depression while taking isotretinoin  and reports of diagnosis of inflammatory bowl disease (IBD) while taking isotretinoin  as well as the lack of evidence for a causal relationship between isotretinoin , depression and IBD. Patient advised to reach out with any questions or concerns. Patient advised not to share pills or donate blood while on treatment or for one month after completing treatment. All patient's considering Isotretinoin  must read and understand and sign Isotretinoin  Consent Form and be registered with I-Pledge.  Xerosis secondary to isotretinoin  therapy - Continue emollients as directed - Xyzal (levocetirizine) once a  day and fish oil 1 gram daily may also help with dryness   Cheilitis secondary to isotretinoin  therapy - Continue lip balm as directed, Dr. Horald Cortibalm recommended   Long term medication management (isotretinoin )  Patient is using long term (months to years) prescription medication  to control their dermatologic condition.  These medications require periodic monitoring to evaluate for efficacy and side effects and may require periodic laboratory monitoring.  - While taking Isotretinoin  and for 30 days after you finish the medication, do not get pregnant, do not share pills, do not donate blood. Isotretinoin  is best absorbed when taken with a fatty meal. Isotretinoin  can make you sensitive to the sun. Daily careful sun protection including sunscreen SPF 30+ when outdoors is recommended.  Follow-up in 60 days.  I, Jill Parcell, CMA, am acting as scribe for Boneta Sharps, MD.   Documentation: I have reviewed the above documentation for accuracy and completeness, and I agree with the above.  Boneta Sharps, MD  "

## 2024-04-27 LAB — TRIGLYCERIDES: Triglycerides: 159 mg/dL — ABNORMAL HIGH (ref 0–149)

## 2024-04-27 LAB — ALT: ALT: 13 IU/L (ref 0–32)

## 2024-04-28 ENCOUNTER — Ambulatory Visit: Payer: Self-pay | Admitting: Dermatology

## 2024-06-18 ENCOUNTER — Ambulatory Visit: Admitting: Dermatology
